# Patient Record
Sex: Male | Born: 2000 | Race: Black or African American | Hispanic: No | Marital: Single | Smoking: Current some day smoker
Health system: Southern US, Community
[De-identification: ages and names within clinical notes are randomized; demographics above are authoritative.]

## PROBLEM LIST (undated history)

## (undated) DIAGNOSIS — F39 Unspecified mood [affective] disorder: Secondary | ICD-10-CM

## (undated) DIAGNOSIS — F4310 Post-traumatic stress disorder, unspecified: Secondary | ICD-10-CM

---
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
## 2022-05-27 ENCOUNTER — Emergency Department
Admission: EM | Admit: 2022-05-27 | Discharge: 2022-05-27 | Disposition: A | Discharge status: Home / Self Care | Payer: Medicaid Other | Attending: Emergency Medicine | Admitting: Emergency Medicine

## 2022-05-27 ENCOUNTER — Other Ambulatory Visit: Payer: Self-pay

## 2022-05-27 ENCOUNTER — Encounter: Payer: Self-pay | Admitting: Emergency Medicine

## 2022-05-27 DIAGNOSIS — T7840XA Allergy, unspecified, initial encounter: Secondary | ICD-10-CM | POA: Diagnosis not present

## 2022-05-27 MED ORDER — FAMOTIDINE 20 MG PO TABS
20.0000 mg | ORAL_TABLET | Freq: Once | ORAL | Status: AC
  Administered 2022-05-27: 20 mg via ORAL
  Filled 2022-05-27: qty 1

## 2022-05-27 MED ORDER — PREDNISONE 20 MG PO TABS
60.0000 mg | ORAL_TABLET | Freq: Once | ORAL | Status: AC
  Administered 2022-05-27: 60 mg via ORAL
  Filled 2022-05-27: qty 3

## 2022-05-27 NOTE — ED Provider Triage Note (Signed)
  Emergency Medicine Provider Triage Evaluation Note  DRUE HARR , a 21 y.o.male,  was evaluated in triage.  Pt complains of suspected allergic reaction.  Patient states that he accidentally ingested cinnamon, which she is allergic to.  It reportedly causes his throat to swell.  He states that his tonsils feel swollen.  No other symptoms at this time.  Patient took 2 pills of Benadryl prior to arrival.   Review of Systems  Positive: Throat swelling, allergic reaction Negative: Denies fever, chest pain, vomiting  Physical Exam  There were no vitals filed for this visit. Gen:   Awake, no distress   Resp:  Normal effort  MSK:   Moves extremities without difficulty  Other:  No wheezing on auscultation.  No significant oropharyngeal edema, no rashes.  Medical Decision Making  Given the patient's initial medical screening exam, the following diagnostic evaluation has been ordered. The patient will be placed in the appropriate treatment space, once one is available, to complete the evaluation and treatment. I have discussed the plan of care with the patient and I have advised the patient that an ED physician or mid-level practitioner will reevaluate their condition after the test results have been received, as the results may give them additional insight into the type of treatment they may need.    Diagnostics: None immediately.  Treatments: Prednisone, famotidine.   Varney Daily, [place] 05/27/22 [phone]

## 2022-05-27 NOTE — ED Provider Notes (Signed)
   [facility] Provider Note    Event Date/Time   First MD Initiated Contact with Patient 05/27/22 1324     (approximate)   History   Allergic Reaction   HPI  Roberto Harrison is a 21 y.o. male shortness of breath and felt like his throat was swelling after eating salmon.  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  Ate cinnamon just prior to arrival.  Received Pepcid and steroids upon arrival to the emergency department.  States that he is feeling much better.  No longer with shortness of breath.  Denies any rash or itching.     Physical Exam   Triage Vital Signs: ED Triage Vitals  Enc Vitals Group     BP 05/27/22 1212 (!) 142/77     Pulse Rate 05/27/22 1212 (!) 55     Resp 05/27/22 1212 20     Temp 05/27/22 1212 98.1 F (36.7 C)     Temp src --      SpO2 05/27/22 1212 100 %     Weight 05/27/22 1202 190 lb (86.2 kg)     Height 05/27/22 1202 6' (1.829 m)     Head Circumference --      Peak Flow --      Pain Score --      Pain Loc --      Pain Edu? --      Excl. in GC? --     Most recent vital signs: Vitals:   05/27/22 1212  BP: (!) 142/77  Pulse: (!) 55  Resp: 20  Temp: 98.1 F (36.7 C)  SpO2: 100%    Physical Exam Constitutional:      Appearance: He is well-developed.  HENT:     Head: Atraumatic.  Eyes:     Conjunctiva/sclera: Conjunctivae normal.  Cardiovascular:     Rate and Rhythm: Regular rhythm.  Pulmonary:     Effort: No respiratory distress.  Musculoskeletal:     Cervical back: Normal range of motion.  Skin:    General: Skin is warm.  Neurological:     Mental Status: He is alert. Mental status is at baseline.      IMPRESSION / MDM / ASSESSMENT AND PLAN / ED COURSE  I reviewed the triage vital signs and the nursing notes.  Differential diagnosis including allergic reaction, seasonal allergies  After treatment patient without any respiratory distress.  No signs of anaphylactic  reaction.  Do not feel that further treatment is necessary at this time.  Discussed not eating cinnamon or other allergies.  Given return precautions for any return of symptoms.  Labs Reviewed - No data to display       PROCEDURES:  Critical Care performed: No  Procedures  Patient's presentation is most consistent with acute, uncomplicated illness.   MEDICATIONS ORDERED IN ED: Medications  predniSONE (DELTASONE) tablet 60 mg (60 mg Oral Given 05/27/22 1211)  famotidine (PEPCID) tablet 20 mg (20 mg Oral Given 05/27/22 1211)    FINAL CLINICAL IMPRESSION(S) / ED DIAGNOSES   Final diagnoses:  Allergic reaction, initial encounter     Rx / DC Orders   ED Discharge Orders     None        Note:  This document was prepared using Dragon voice recognition software and may include unintentional dictation errors.   Corena Herter, MD 05/27/22 [phone]

## 2022-05-27 NOTE — ED Triage Notes (Signed)
Pt via POV from home. Pt allergic reaction to cinnamon with known allergy to cinnamon. Friend gave patient 2 benadryl around 15 mins ago. Pt is A&OX4 and NAD

## 2022-05-27 NOTE — ED Notes (Signed)
This RN reviewed paperwork with pt. No further complaints or questions. Pt ambulated to lobby. Signed discharge form placed in med rec box.  

## 2022-09-12 ENCOUNTER — Other Ambulatory Visit: Payer: Self-pay

## 2022-09-12 ENCOUNTER — Emergency Department
Admission: EM | Admit: 2022-09-12 | Discharge: 2022-09-12 | Disposition: A | Discharge status: Home / Self Care | Payer: Medicaid Other | Attending: Emergency Medicine | Admitting: Emergency Medicine

## 2022-09-12 DIAGNOSIS — A549 Gonococcal infection, unspecified: Secondary | ICD-10-CM | POA: Diagnosis not present

## 2022-09-12 DIAGNOSIS — R1030 Lower abdominal pain, unspecified: Secondary | ICD-10-CM | POA: Diagnosis present

## 2022-09-12 DIAGNOSIS — R300 Dysuria: Secondary | ICD-10-CM

## 2022-09-12 LAB — COMPREHENSIVE METABOLIC PANEL
ALT: 10 U/L (ref 0–44)
AST: 17 U/L (ref 15–41)
Albumin: 4 g/dL (ref 3.5–5.0)
Alkaline Phosphatase: 45 U/L (ref 38–126)
Anion gap: 6 (ref 5–15)
BUN: 11 mg/dL (ref 6–20)
CO2: 28 mmol/L (ref 22–32)
Calcium: 9 mg/dL (ref 8.9–10.3)
Chloride: 105 mmol/L (ref 98–111)
Creatinine, Ser: 1.08 mg/dL (ref 0.61–1.24)
GFR, Estimated: >60 mL/min (ref >60)
Glucose, Bld: 131 mg/dL — ABNORMAL HIGH (ref 70–99)
Potassium: 4.1 mmol/L (ref 3.5–5.1)
Sodium: 139 mmol/L (ref 135–145)
Total Bilirubin: 0.6 mg/dL (ref 0.3–1.2)
Total Protein: 6.9 g/dL (ref 6.5–8.1)

## 2022-09-12 LAB — CBC WITH DIFFERENTIAL/PLATELET
Abs Immature Granulocytes: 0.01 10*3/uL (ref 0.00–0.07)
Basophils Absolute: 0 10*3/uL (ref 0.0–0.1)
Basophils Relative: 1 %
Eosinophils Absolute: 0.1 10*3/uL (ref 0.0–0.5)
Eosinophils Relative: 1 %
HCT: 42.7 % (ref 39.0–52.0)
Hemoglobin: 14.1 g/dL (ref 13.0–17.0)
Immature Granulocytes: 0 %
Lymphocytes Relative: 26 %
Lymphs Abs: 1.3 10*3/uL (ref 0.7–4.0)
MCH: 30.5 pg (ref 26.0–34.0)
MCHC: 33 g/dL (ref 30.0–36.0)
MCV: 92.2 fL (ref 80.0–100.0)
Monocytes Absolute: 0.5 10*3/uL (ref 0.1–1.0)
Monocytes Relative: 10 %
Neutro Abs: 3.1 10*3/uL (ref 1.7–7.7)
Neutrophils Relative %: 62 %
Platelets: 224 10*3/uL (ref 150–400)
RBC: 4.63 MIL/uL (ref 4.22–5.81)
RDW: 13 % (ref 11.5–15.5)
WBC: 4.9 10*3/uL (ref 4.0–10.5)
nRBC: 0 % (ref 0.0–0.2)

## 2022-09-12 LAB — URINALYSIS, ROUTINE W REFLEX MICROSCOPIC
Bilirubin Urine: NEGATIVE
Glucose, UA: NEGATIVE mg/dL
Hgb urine dipstick: NEGATIVE
Ketones, ur: NEGATIVE mg/dL
Nitrite: NEGATIVE
Protein, ur: NEGATIVE mg/dL
Specific Gravity, Urine: 1.023 (ref 1.005–1.030)
WBC, UA: >50 WBC/hpf (ref 0–5)
pH: 6 (ref 5.0–8.0)

## 2022-09-12 LAB — URINE DRUG SCREEN, QUALITATIVE (ARMC ONLY)
Amphetamines, Ur Screen: NOT DETECTED
Barbiturates, Ur Screen: NOT DETECTED
Benzodiazepine, Ur Scrn: NOT DETECTED
Cannabinoid 50 Ng, Ur ~~LOC~~: POSITIVE — AB
Cocaine Metabolite,Ur ~~LOC~~: NOT DETECTED
MDMA (Ecstasy)Ur Screen: NOT DETECTED
Methadone Scn, Ur: NOT DETECTED
Opiate, Ur Screen: NOT DETECTED
Phencyclidine (PCP) Ur S: NOT DETECTED
Tricyclic, Ur Screen: NOT DETECTED

## 2022-09-12 LAB — CHLAMYDIA/NGC RT PCR (ARMC ONLY)
Chlamydia Tr: NOT DETECTED
N gonorrhoeae: DETECTED — AB

## 2022-09-12 MED ORDER — DOXYCYCLINE HYCLATE 100 MG PO CAPS: 100.0000 mg | ORAL_CAPSULE | Freq: Two times a day (BID) | ORAL | 0 refills | Status: AC

## 2022-09-12 MED ORDER — LIDOCAINE HCL (PF) 1 % IJ SOLN
5.0000 mL | Freq: Once | INTRAMUSCULAR | Status: AC
  Administered 2022-09-12: 5 mL
  Filled 2022-09-12: qty 5

## 2022-09-12 MED ORDER — CEFTRIAXONE SODIUM 1 G IJ SOLR
500.0000 mg | Freq: Once | INTRAMUSCULAR | Status: AC
  Administered 2022-09-12: 500 mg via INTRAMUSCULAR
  Filled 2022-09-12: qty 10

## 2022-09-12 NOTE — Discharge Instructions (Signed)
Follow-up with your primary care provider or Dr. Annabell Howells who is on-call for urology if any continued problems.  Also you can follow-up with the [facility] department STD clinic if you continue to have any continued urinary symptoms.  The results of your gonorrhea and Chlamydia test can be seen on MyChart.  The antibiotics today will also treat STD infections.  Avoid sexual activity for the next 5 days and also remember to wear condoms each time.  If you see that your test results on MyChart are positive you should tell your sexual partner to also be tested and treated.  This can be done through the health department for free.
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
## 2022-09-12 NOTE — ED Notes (Signed)
Lab called to add on Chlamydia order.
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
## 2022-11-29 ENCOUNTER — Emergency Department
Admission: EM | Admit: 2022-11-29 | Discharge: 2022-11-29 | Disposition: A | Discharge status: Home / Self Care | Payer: Medicaid Other | Attending: Emergency Medicine | Admitting: Emergency Medicine

## 2022-11-29 ENCOUNTER — Emergency Department: Payer: Medicaid Other

## 2022-11-29 DIAGNOSIS — Z23 Encounter for immunization: Secondary | ICD-10-CM | POA: Diagnosis not present

## 2022-11-29 DIAGNOSIS — Y92410 Unspecified street and highway as the place of occurrence of the external cause: Secondary | ICD-10-CM | POA: Diagnosis not present

## 2022-11-29 DIAGNOSIS — S99912A Unspecified injury of left ankle, initial encounter: Secondary | ICD-10-CM | POA: Diagnosis present

## 2022-11-29 DIAGNOSIS — S91012A Laceration without foreign body, left ankle, initial encounter: Secondary | ICD-10-CM | POA: Diagnosis not present

## 2022-11-29 MED ORDER — TETANUS-DIPHTH-ACELL PERTUSSIS 5-2.5-18.5 LF-MCG/0.5 IM SUSY
0.5000 mL | PREFILLED_SYRINGE | Freq: Once | INTRAMUSCULAR | Status: AC
  Administered 2022-11-29: 0.5 mL via INTRAMUSCULAR
  Filled 2022-11-29: qty 0.5

## 2022-11-29 MED ORDER — BACITRACIN 500 UNIT/GM EX OINT: 1.0000 | TOPICAL_OINTMENT | Freq: Two times a day (BID) | CUTANEOUS | 0 refills | Status: AC

## 2022-11-29 MED ORDER — BACITRACIN 500 UNIT/GM EX OINT: 1.0000 | TOPICAL_OINTMENT | Freq: Two times a day (BID) | CUTANEOUS | 0 refills | Status: DC
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
## 2022-11-29 NOTE — Discharge Instructions (Signed)
Apply bacitracin over your wounds.  If you develop redness or pus draining from them then please return to the emergency department.  You can take Tylenol and Motrin for pain.
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
## 2023-02-12 ENCOUNTER — Other Ambulatory Visit: Payer: Self-pay

## 2023-02-12 ENCOUNTER — Emergency Department
Admission: EM | Admit: 2023-02-12 | Discharge: 2023-02-12 | Disposition: A | Discharge status: Home / Self Care | Payer: PRIVATE HEALTH INSURANCE | Attending: Emergency Medicine | Admitting: Emergency Medicine

## 2023-02-12 DIAGNOSIS — R300 Dysuria: Secondary | ICD-10-CM | POA: Diagnosis not present

## 2023-02-12 DIAGNOSIS — X19XXXA Contact with other heat and hot substances, initial encounter: Secondary | ICD-10-CM | POA: Diagnosis not present

## 2023-02-12 DIAGNOSIS — T310 Burns involving less than 10% of body surface: Secondary | ICD-10-CM | POA: Diagnosis not present

## 2023-02-12 DIAGNOSIS — T24102A Burn of first degree of unspecified site of left lower limb, except ankle and foot, initial encounter: Secondary | ICD-10-CM | POA: Diagnosis present

## 2023-02-12 MED ORDER — METRONIDAZOLE 500 MG PO TABS
2000.0000 mg | ORAL_TABLET | Freq: Once | ORAL | Status: AC
  Administered 2023-02-12: 2000 mg via ORAL
  Filled 2023-02-12: qty 4

## 2023-02-12 MED ORDER — LIDOCAINE 5 % EX OINT: 1.0000 | TOPICAL_OINTMENT | CUTANEOUS | 0 refills | Status: AC | PRN
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
## 2023-02-15 NOTE — Group Note (Deleted)

## 2023-04-04 ENCOUNTER — Emergency Department
Admission: EM | Admit: 2023-04-04 | Discharge: 2023-04-04 | Disposition: A | Discharge status: Home / Self Care | Payer: PRIVATE HEALTH INSURANCE | Attending: Emergency Medicine | Admitting: Emergency Medicine

## 2023-04-04 ENCOUNTER — Other Ambulatory Visit: Payer: Self-pay

## 2023-04-04 DIAGNOSIS — Z1152 Encounter for screening for COVID-19: Secondary | ICD-10-CM | POA: Insufficient documentation

## 2023-04-04 DIAGNOSIS — R309 Painful micturition, unspecified: Secondary | ICD-10-CM | POA: Diagnosis present

## 2023-04-04 DIAGNOSIS — R300 Dysuria: Secondary | ICD-10-CM | POA: Diagnosis not present

## 2023-04-04 LAB — RESP PANEL BY RT-PCR (RSV, FLU A&B, COVID)  RVPGX2
Influenza A by PCR: NEGATIVE
Influenza B by PCR: NEGATIVE
Resp Syncytial Virus by PCR: NEGATIVE
SARS Coronavirus 2 by RT PCR: NEGATIVE

## 2023-04-04 LAB — URINALYSIS, COMPLETE (UACMP) WITH MICROSCOPIC
Bilirubin Urine: NEGATIVE
Glucose, UA: NEGATIVE mg/dL
Hgb urine dipstick: NEGATIVE
Ketones, ur: 20 mg/dL — AB
Leukocytes,Ua: NEGATIVE
Nitrite: NEGATIVE
Protein, ur: 30 mg/dL — AB
Specific Gravity, Urine: 1.028 (ref 1.005–1.030)
pH: 6 (ref 5.0–8.0)

## 2023-04-04 LAB — CHLAMYDIA/NGC RT PCR (ARMC ONLY)
Chlamydia Tr: NOT DETECTED
N gonorrhoeae: NOT DETECTED

## 2023-04-04 LAB — GROUP A STREP BY PCR: Group A Strep by PCR: NOT DETECTED

## 2023-04-04 MED ORDER — CEFTRIAXONE SODIUM 250 MG IJ SOLR
250.0000 mg | Freq: Once | INTRAMUSCULAR | Status: AC
  Administered 2023-04-04: 250 mg via INTRAMUSCULAR
  Filled 2023-04-04 (×2): qty 250

## 2023-04-04 MED ORDER — LIDOCAINE HCL (PF) 1 % IJ SOLN
5.0000 mL | Freq: Once | INTRAMUSCULAR | Status: AC
  Administered 2023-04-04: 5 mL
  Filled 2023-04-04: qty 5

## 2023-04-04 MED ORDER — AZITHROMYCIN 500 MG PO TABS
1000.0000 mg | ORAL_TABLET | Freq: Once | ORAL | Status: AC
  Administered 2023-04-04: 1000 mg via ORAL
  Filled 2023-04-04: qty 2
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
## 2023-04-06 ENCOUNTER — Other Ambulatory Visit: Payer: Self-pay

## 2023-04-06 DIAGNOSIS — J029 Acute pharyngitis, unspecified: Secondary | ICD-10-CM | POA: Diagnosis present

## 2023-04-06 DIAGNOSIS — J039 Acute tonsillitis, unspecified: Secondary | ICD-10-CM | POA: Diagnosis not present

## 2023-04-06 DIAGNOSIS — Z20822 Contact with and (suspected) exposure to covid-19: Secondary | ICD-10-CM | POA: Diagnosis not present

## 2023-04-06 LAB — GROUP A STREP BY PCR: Group A Strep by PCR: NOT DETECTED

## 2023-04-06 LAB — RESP PANEL BY RT-PCR (RSV, FLU A&B, COVID)  RVPGX2
Influenza A by PCR: NEGATIVE
Influenza B by PCR: NEGATIVE
Resp Syncytial Virus by PCR: NEGATIVE
SARS Coronavirus 2 by RT PCR: NEGATIVE

## 2023-04-06 MED ORDER — ACETAMINOPHEN 500 MG PO TABS
1000.0000 mg | ORAL_TABLET | Freq: Once | ORAL | Status: AC
  Administered 2023-04-06: 1000 mg via ORAL
  Filled 2023-04-06: qty 2

## 2023-04-06 NOTE — ED Triage Notes (Signed)
Pt presents to ER with c/o sore throat, fever and chills that started yesterday.  Pt also states he is having some sob at this time.  Pt is noted to be hyperventilating in triage at this time.  Pt not wanting to speak in triage d/t pain in throat, so pts s/o is providing history.  Pt is otherwise A&O x4 in triage.

## 2023-04-07 ENCOUNTER — Emergency Department
Admission: EM | Admit: 2023-04-07 | Discharge: 2023-04-07 | Disposition: A | Discharge status: Home / Self Care | Payer: PRIVATE HEALTH INSURANCE | Attending: Emergency Medicine | Admitting: Emergency Medicine

## 2023-04-07 ENCOUNTER — Emergency Department: Payer: PRIVATE HEALTH INSURANCE

## 2023-04-07 DIAGNOSIS — J039 Acute tonsillitis, unspecified: Secondary | ICD-10-CM

## 2023-04-07 DIAGNOSIS — J029 Acute pharyngitis, unspecified: Secondary | ICD-10-CM

## 2023-04-07 LAB — CBC WITH DIFFERENTIAL/PLATELET
Abs Immature Granulocytes: 0.02 10*3/uL (ref 0.00–0.07)
Basophils Absolute: 0 10*3/uL (ref 0.0–0.1)
Basophils Relative: 0 %
Eosinophils Absolute: 0 10*3/uL (ref 0.0–0.5)
Eosinophils Relative: 0 %
HCT: 41.9 % (ref 39.0–52.0)
Hemoglobin: 14.3 g/dL (ref 13.0–17.0)
Immature Granulocytes: 0 %
Lymphocytes Relative: 15 %
Lymphs Abs: 1 10*3/uL (ref 0.7–4.0)
MCH: 29.7 pg (ref 26.0–34.0)
MCHC: 34.1 g/dL (ref 30.0–36.0)
MCV: 86.9 fL (ref 80.0–100.0)
Monocytes Absolute: 1.5 10*3/uL — ABNORMAL HIGH (ref 0.1–1.0)
Monocytes Relative: 22 %
Neutro Abs: 4.4 10*3/uL (ref 1.7–7.7)
Neutrophils Relative %: 63 %
Platelets: 187 10*3/uL (ref 150–400)
RBC: 4.82 MIL/uL (ref 4.22–5.81)
RDW: 12.5 % (ref 11.5–15.5)
WBC: 7 10*3/uL (ref 4.0–10.5)
nRBC: 0 % (ref 0.0–0.2)

## 2023-04-07 LAB — BASIC METABOLIC PANEL
Anion gap: 13 (ref 5–15)
BUN: 19 mg/dL (ref 6–20)
CO2: 21 mmol/L — ABNORMAL LOW (ref 22–32)
Calcium: 9.2 mg/dL (ref 8.9–10.3)
Chloride: 99 mmol/L (ref 98–111)
Creatinine, Ser: 1.18 mg/dL (ref 0.61–1.24)
GFR, Estimated: >60 mL/min (ref >60)
Glucose, Bld: 97 mg/dL (ref 70–99)
Potassium: 3.5 mmol/L (ref 3.5–5.1)
Sodium: 133 mmol/L — ABNORMAL LOW (ref 135–145)

## 2023-04-07 MED ORDER — MAGIC MOUTHWASH
10.0000 mL | Freq: Once | ORAL | Status: DC
  Filled 2023-04-07: qty 10

## 2023-04-07 MED ORDER — KETOROLAC TROMETHAMINE 30 MG/ML IJ SOLN
15.0000 mg | Freq: Once | INTRAMUSCULAR | Status: AC
  Administered 2023-04-07: 15 mg via INTRAVENOUS
  Filled 2023-04-07: qty 1

## 2023-04-07 MED ORDER — SODIUM CHLORIDE 0.9 % IV SOLN
3.0000 g | Freq: Once | INTRAVENOUS | Status: AC
  Administered 2023-04-07: 3 g via INTRAVENOUS
  Filled 2023-04-07: qty 8

## 2023-04-07 MED ORDER — MAGIC MOUTHWASH W/LIDOCAINE
10.0000 mL | Freq: Once | ORAL | Status: AC
  Administered 2023-04-07: 10 mL via ORAL
  Filled 2023-04-07: qty 10

## 2023-04-07 MED ORDER — AMOXICILLIN-POT CLAVULANATE 600-42.9 MG/5ML PO SUSR: 600.0000 mg | Freq: Two times a day (BID) | ORAL | 0 refills | Status: AC

## 2023-04-07 MED ORDER — IOHEXOL 300 MG/ML  SOLN
75.0000 mL | Freq: Once | INTRAMUSCULAR | Status: AC | PRN
  Administered 2023-04-07: 75 mL via INTRAVENOUS

## 2023-04-07 MED ORDER — HYDROCODONE-ACETAMINOPHEN 7.5-325 MG/15ML PO SOLN
10.0000 mL | Freq: Once | ORAL | Status: AC
  Administered 2023-04-07: 10 mL via ORAL
  Filled 2023-04-07: qty 15

## 2023-04-07 MED ORDER — SODIUM CHLORIDE 0.9 % IV BOLUS
500.0000 mL | Freq: Once | INTRAVENOUS | Status: AC
  Administered 2023-04-07: 500 mL via INTRAVENOUS

## 2023-04-07 MED ORDER — HYDROCODONE-ACETAMINOPHEN 7.5-325 MG/15ML PO SOLN
10.0000 mL | Freq: Four times a day (QID) | ORAL | 0 refills | Status: AC | PRN
Start: 2023-04-07 — End: 2024-04-06

## 2023-04-07 MED ORDER — DEXAMETHASONE SODIUM PHOSPHATE 10 MG/ML IJ SOLN
10.0000 mg | Freq: Once | INTRAMUSCULAR | Status: AC
  Administered 2023-04-07: 10 mg via INTRAVENOUS
  Filled 2023-04-07: qty 1

## 2023-04-07 NOTE — Discharge Instructions (Signed)
Take and finish antibiotic as prescribed.  You may take Lortab elixir as needed for sore throat.  Drink plenty of liquids daily.  Return to the ER for worsening symptoms, persistent vomiting, difficulty breathing or other concerns.

## 2023-04-07 NOTE — ED Provider Notes (Signed)
[facility] Provider Note    Event Date/Time   First MD Initiated Contact with Patient 04/07/23 [phone]     (approximate)   History   Sore Throat   HPI  Roberto Harrison is a 22 y.o. male who presents to the ED from home with persistent sore throat, fever and chills.  Patient was seen in the ED several days ago for multiple medical complaints, treated empirically for STD with Rocephin and azithromycin.  STD screen has since come back negative.  Denies chest pain, shortness of breath, abdominal pain, nausea, vomiting or dizziness.     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 No pertinent surgical history.   Home Medications   Prior to Admission medications   Medication Sig Start Date End Date Taking? Authorizing Provider  bacitracin 500 UNIT/GM ointment Apply 1 Application topically 2 (two) times daily. 11/29/22   Georga Hacking, MD  doxycycline (VIBRAMYCIN) 100 MG capsule Take 1 capsule (100 mg total) by mouth 2 (two) times daily. 09/12/22   Tommi Rumps, PA-C  lidocaine (XYLOCAINE) 5 % ointment Apply 1 Application topically as needed. 02/12/23   Merwyn Katos, MD  cetirizine (ZYRTEC) 10 MG tablet Take 10 mg by mouth at bedtime as needed for allergies.   05/28/20  [provider]  chlorproMAZINE (THORAZINE) 50 MG tablet Take 50 mg by mouth at bedtime. Pt also takes every eight hours as needed for agitation.  05/28/20  [provider]  diphenhydrAMINE (BENADRYL) 50 MG capsule Take 50 mg by mouth at bedtime. Pt also takes every eight hours as needed for agitation.  05/28/20  [provider]  levothyroxine (SYNTHROID,  LEVOTHROID) 25 MCG tablet Take 25 mcg by mouth daily before breakfast.  05/28/20  [provider]  lithium carbonate (ESKALITH) 450 MG CR tablet Take 450 mg by mouth 2 (two) times daily.   05/28/20  [provider]  prazosin (MINIPRESS) 2 MG capsule Take 2 mg by mouth at bedtime.  05/28/20  [provider]     Allergies  Cinnamon flavor and Grass pollen(k-[id number]-r-t-swt vern)   Family History  History reviewed. No pertinent family history.   Physical Exam  Triage Vital Signs: ED Triage Vitals [04/06/23 2212]  Encounter Vitals Group     BP 121/72     Systolic BP Percentile      Diastolic BP Percentile      Pulse Rate (!) 107     Resp (!) 26     Temp (!) 100.7 F (38.2 C)     Temp Source Oral     SpO2 100 %     Weight      Height      Head Circumference      Peak Flow      Pain Score 10     Pain Loc      Pain Education      Exclude from Growth Chart     Updated Vital Signs: BP (!) 121/106 (BP Location: Right Arm)   Pulse 65   Temp 98.8 F (37.1 C) (Oral)   Resp 16   SpO2 99%  General: Awake, mild distress.  CV:  RRR.  Good peripheral perfusion.  Resp:  Normal effort.  CTAB. Abd:  Nontender.  No distention.  Other:  Moderately erythematous posterior oropharynx with bilateral tonsillar swelling and exudates, right greater than left.  Mildly hoarse voice.  There is no muffled voice or drooling.  Tolerating secretions well.  Shotty anterior cervical lymphadenopathy.   ED Results / Procedures / Treatments  Labs (all labs ordered are listed, but only abnormal results are displayed) Labs Reviewed  CBC WITH DIFFERENTIAL/PLATELET - Abnormal; Notable for the following components:      Result Value   Monocytes Absolute 1.5 (*)    All other components within normal limits  BASIC METABOLIC PANEL - Abnormal; Notable for the following components:   Sodium 133 (*)    CO2 21 (*)    All other components within normal limits  GROUP A STREP BY PCR   RESP PANEL BY RT-PCR (RSV, FLU A&B, COVID)  [id number]     EKG  None   RADIOLOGY I have independently visualized and interpreted patient's imaging study as well as noted the radiology interpretation:  CT soft tissue neck: Uncomplicated tonsillitis  Official radiology report(s): CT Soft Tissue Neck W Contrast  Result Date: 04/07/2023 CLINICAL DATA:  Sore throat with fever and chills started yesterday, epiglottitis or tonsillitis suspected. EXAM: CT NECK WITH CONTRAST TECHNIQUE: Multidetector CT imaging of the neck was performed using the standard protocol following the bolus administration of intravenous contrast. RADIATION DOSE REDUCTION: This exam was performed according to the departmental dose-optimization program which includes automated exposure control, adjustment of the mA and/or kV according to patient size and/or use of iterative reconstruction technique. CONTRAST:  75mL OMNIPAQUE IOHEXOL 300 MG/ML  SOLN COMPARISON:  None Available. FINDINGS: Pharynx and larynx: Tonsillar thickening without collection or retropharyngeal edema. No supraglottitis. Salivary glands: No inflammation, mass, or stone. Thyroid: Normal Lymph nodes: Adenitis appearance in the upper neck without cavitation Vascular: Negative. Limited intracranial: Negative. Visualized orbits: Negative. Mastoids and visualized paranasal sinuses: Clear Skeleton: Unremarkable Upper chest: Clear IMPRESSION: Uncomplicated tonsillitis. Electronically Signed   By: Tiburcio Pea M.D.   On: 04/07/2023 04:08     PROCEDURES:  Critical Care performed: No  Procedures   MEDICATIONS ORDERED IN ED: Medications  acetaminophen (TYLENOL) tablet 1,000 mg (1,000 mg Oral Given 04/06/23 2217)  Ampicillin-Sulbactam (UNASYN) 3 g in sodium chloride 0.9 % 100 mL IVPB (0 g Intravenous Stopped 04/07/23 0337)  dexamethasone (DECADRON) injection 10 mg (10 mg Intravenous Given 04/07/23 0225)  sodium chloride 0.9 % bolus 500 mL (0 mLs Intravenous  Stopped 04/07/23 0336)  ketorolac (TORADOL) 30 MG/ML injection 15 mg (15 mg Intravenous Given 04/07/23 0225)  magic mouthwash w/lidocaine (10 mLs Oral Given 04/07/23 0323)  iohexol (OMNIPAQUE) 300 MG/ML solution 75 mL (75 mLs Intravenous Contrast Given 04/07/23 0259)  HYDROcodone-acetaminophen (HYCET) 7.5-325 mg/15 ml solution 10 mL (10 mLs Oral Given 04/07/23 0347)     IMPRESSION / MDM / ASSESSMENT AND PLAN / ED COURSE  I reviewed the triage vital signs and the nursing notes.                             22 year old male presenting with persistent sore throat, painful swallowing, subjective fever/chills.  Differential diagnosis includes but is not limited to tonsillitis, peritonsillar abscess, retropharyngeal abscess, epiglottitis, etc.  I have personally reviewed patient's ER visit from 04/04/2023.  Patient's presentation is most consistent with acute illness /  injury with system symptoms.  Torrey panel and group A strep are negative.  Will obtain basic lab work, CT soft tissue neck.  Administer IV fluids, start IV Unasyn, Decadron, Toradol and Magic mouthwash for symptomatic relief.  Will reassess.  Clinical Course as of 04/07/23 0414  Wed Apr 07, 2023  0413 CT demonstrates tonsillitis.  Will discharge home on Augmentin, Lortab elixir and patient will follow-up with ENT.  Strict return precautions given.  Patient and family member verbalized understanding and agree with plan of care. [JS]    Clinical Course User Index [JS] Irean Hong, MD     FINAL CLINICAL IMPRESSION(S) / ED DIAGNOSES   Final diagnoses:  Sore throat  Tonsillitis     Rx / DC Orders   ED Discharge Orders     None        Note:  This document was prepared using Dragon voice recognition software and may include unintentional dictation errors.   Irean Hong, MD 04/07/23 [phone]

## 2023-04-10 ENCOUNTER — Other Ambulatory Visit: Payer: Self-pay

## 2023-04-10 ENCOUNTER — Encounter: Payer: Self-pay | Admitting: Emergency Medicine

## 2023-04-10 ENCOUNTER — Emergency Department
Admission: EM | Admit: 2023-04-10 | Discharge: 2023-04-10 | Disposition: A | Discharge status: Home / Self Care | Payer: PRIVATE HEALTH INSURANCE | Attending: Emergency Medicine | Admitting: Emergency Medicine

## 2023-04-10 DIAGNOSIS — J029 Acute pharyngitis, unspecified: Secondary | ICD-10-CM | POA: Insufficient documentation

## 2023-04-10 MED ORDER — LIDOCAINE VISCOUS HCL 2 % MT SOLN
15.0000 mL | Freq: Once | OROMUCOSAL | Status: AC
  Administered 2023-04-10: 15 mL via OROMUCOSAL
  Filled 2023-04-10: qty 15

## 2023-04-10 MED ORDER — MAGIC MOUTHWASH: ORAL | 0 refills | Status: AC

## 2023-04-10 MED ORDER — HYDROCODONE-ACETAMINOPHEN 7.5-325 MG/15ML PO SOLN
10.0000 mL | Freq: Once | ORAL | Status: AC
  Administered 2023-04-10: 10 mL via ORAL
  Filled 2023-04-10: qty 15

## 2023-04-10 MED ORDER — MAGIC MOUTHWASH
10.0000 mL | Freq: Once | ORAL | Status: AC
  Administered 2023-04-10: 10 mL via ORAL
  Filled 2023-04-10: qty 10

## 2023-04-10 NOTE — Discharge Instructions (Signed)
Continue to take and finish antibiotic as previously prescribed.  You may also take Magic mouthwash as needed for throat discomfort.  Drink plenty of fluids daily.  Return to the ER for worsening symptoms, persistent vomiting, difficulty breathing or other concerns.

## 2023-04-10 NOTE — ED Triage Notes (Signed)
Pt presents with increasing and worsening pain to his throat.  Has been taking meds as prescribed on 10/30 and states he has been doubling and tripling the doses but has gotten worse.

## 2023-04-10 NOTE — ED Provider Notes (Signed)
[facility] Provider Note    Event Date/Time   First MD Initiated Contact with Patient 04/10/23 0530     (approximate)   History   Sore Throat   HPI  Roberto Harrison is a 22 y.o. male who returns to the ED from home for persistent sore throat.  Patient was seen in the ED 04/07/2023 for for same and diagnosed with tonsillitis.  Had lab work and CT scan.  Placed on Amoxicillin and Hycet.  States Hycet is not controlling his pain.  Reports compliance with medications.  Denies fever/chills, chest pain, shortness of breath, abdominal pain, nausea, vomiting or dizziness.     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 No pertinent surgical history.   Home Medications   Prior to Admission medications   Medication Sig Start Date End Date Taking? Authorizing Provider  magic mouthwash SOLN 12mL Anbesol 30mL Benadryl 30mL Mylanta  5mL swish, gargle & spit q8hr prn throat discomfort 04/10/23  Yes Irean Hong, MD  amoxicillin-clavulanate (AUGMENTIN ES-600) 600-42.9 MG/5ML suspension Take 5 mLs (600 mg total) by mouth every 12 (twelve) hours for 10 days. 04/07/23 04/17/23  Irean Hong, MD  bacitracin 500 UNIT/GM ointment Apply 1 Application topically 2 (two) times daily. 11/29/22   Georga Hacking, MD  doxycycline (VIBRAMYCIN) 100 MG capsule Take 1 capsule (100 mg total) by mouth 2 (two) times daily. 09/12/22   Tommi Rumps, PA-C  HYDROcodone-acetaminophen (HYCET) 7.5-325 mg/15 ml solution Take 10 mLs by mouth every 6 (six) hours as needed for moderate pain (pain score 4-6). 04/07/23 04/06/24  Irean Hong, MD  lidocaine (XYLOCAINE) 5 % ointment Apply 1  Application topically as needed. 02/12/23   Merwyn Katos, MD  cetirizine (ZYRTEC) 10 MG tablet Take 10 mg by mouth at bedtime as needed for allergies.   05/28/20  [provider]  chlorproMAZINE (THORAZINE) 50 MG tablet Take 50 mg by mouth at bedtime. Pt also takes every eight hours as needed for agitation.  05/28/20  [provider]  diphenhydrAMINE (BENADRYL) 50 MG capsule Take 50 mg by mouth at bedtime. Pt also takes every eight hours as needed for agitation.  05/28/20  [provider]  levothyroxine (SYNTHROID, LEVOTHROID) 25 MCG tablet Take 25 mcg by mouth daily before breakfast.  05/28/20  [provider]  lithium carbonate (ESKALITH) 450 MG CR tablet Take 450 mg by mouth 2 (two) times daily.   05/28/20  [provider]  prazosin (MINIPRESS) 2 MG capsule Take 2 mg by mouth at bedtime.  05/28/20  [provider]     Allergies  Cinnamon flavor and Grass pollen(k-o-r-t-swt vern)   Family History  History reviewed. No pertinent family history.   Physical Exam  Triage Vital Signs: ED Triage Vitals  Encounter Vitals Group     BP 04/10/23 0101 126/83     Systolic BP Percentile --      Diastolic BP Percentile --      Pulse Rate 04/10/23 0101 84     Resp 04/10/23 0101 16     Temp 04/10/23 0101 98.9 F (37.2 C)  Temp Source 04/10/23 0101 Oral     SpO2 04/10/23 0101 97 %     Weight --      Height --      Head Circumference --      Peak Flow --      Pain Score 04/10/23 0101 10     Pain Loc --      Pain Education --      Exclude from Growth Chart --     Updated Vital Signs: BP 126/83 (BP Location: Left Arm)   Pulse 84   Temp 98.9 F (37.2 C) (Oral)   Resp 16   SpO2 97%    General: Asleep, shakened awake for exam, no distress.  CV:  RRR.  Good peripheral perfusion.  Resp:  Normal effort.  CTAB. Abd:  No distention.  Other:  Posterior oropharynx appears improved from prior exam with less erythema and exudates.  No  peritonsillar abscess.  There is no hoarse or muffled voice.  There is no drooling.   ED Results / Procedures / Treatments  Labs (all labs ordered are listed, but only abnormal results are displayed) Labs Reviewed - No data to display   EKG  None   RADIOLOGY None   Official radiology report(s): No results found.   PROCEDURES:  Critical Care performed: No  Procedures   MEDICATIONS ORDERED IN ED: Medications  magic mouthwash (has no administration in time range)  HYDROcodone-acetaminophen (HYCET) 7.5-325 mg/15 ml solution 10 mL (10 mLs Oral Given 04/10/23 0320)  lidocaine (XYLOCAINE) 2 % viscous mouth solution 15 mL (15 mLs Mouth/Throat Given 04/10/23 0320)     IMPRESSION / MDM / ASSESSMENT AND PLAN / ED COURSE  I reviewed the triage vital signs and the nursing notes.                             22 year old male returns to the ED for persistent sore throat.  I was the provider to examine patient's last visit.  Oropharynx appears improved compared to prior with less exudates and swelling will add Magic mouthwash, encourage patient to finish antibiotic and follow-up with ENT as needed.  Strict return precautions given.  Patient verbalizes understanding and agrees with plan of care.  Patient's presentation is most consistent with acute, uncomplicated illness.   FINAL CLINICAL IMPRESSION(S) / ED DIAGNOSES   Final diagnoses:  Sore throat     Rx / DC Orders   ED Discharge Orders          Ordered    magic mouthwash SOLN        04/10/23 0536             Note:  This document was prepared using Dragon voice recognition software and may include unintentional dictation errors.   Irean Hong, MD 04/10/23 [phone]

## 2024-02-10 ENCOUNTER — Emergency Department
Admission: EM | Admit: 2024-02-10 | Discharge: 2024-02-10 | Disposition: A | Discharge status: Home / Self Care | Payer: Self-pay | Attending: Emergency Medicine | Admitting: Emergency Medicine

## 2024-02-10 ENCOUNTER — Other Ambulatory Visit: Payer: Self-pay

## 2024-02-10 ENCOUNTER — Emergency Department: Payer: Self-pay

## 2024-02-10 DIAGNOSIS — W228XXA Striking against or struck by other objects, initial encounter: Secondary | ICD-10-CM | POA: Insufficient documentation

## 2024-02-10 DIAGNOSIS — S8011XA Contusion of right lower leg, initial encounter: Secondary | ICD-10-CM | POA: Insufficient documentation

## 2024-02-10 DIAGNOSIS — Y990 Civilian activity done for income or pay: Secondary | ICD-10-CM | POA: Insufficient documentation

## 2024-02-10 DIAGNOSIS — M7989 Other specified soft tissue disorders: Secondary | ICD-10-CM | POA: Insufficient documentation

## 2024-02-10 DIAGNOSIS — M79604 Pain in right leg: Secondary | ICD-10-CM
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
## 2024-02-10 NOTE — ED Provider Notes (Signed)
 [facility] Provider Note    Event Date/Time   First MD Initiated Contact with Patient 02/10/24 1556     (approximate)   History   Leg Pain   HPI  Roberto Harrison is a 23 y.o. 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  States when he moves this right leg, he feels pain from his ankle to his hip. Reports swelling to anterior right shin. Denies fever, chills, erythema.  Patient is filing Worker's Comp at this time.  Has taken 2 doses of 800 mg ibuprofen at home for his pain with not much relief. Denies fall or any other injury.    Physical Exam   Triage Vital Signs: ED Triage Vitals  Encounter Vitals Group     BP 02/10/24 1335 (!) 125/56     Girls Systolic BP Percentile --      Girls Diastolic BP Percentile --      Boys Systolic BP Percentile --      Boys Diastolic BP Percentile --      Pulse Rate 02/10/24 1335 (!) 52     Resp 02/10/24 1335 18     Temp 02/10/24 1335 98.3 F (36.8 C)     Temp Source 02/10/24 1335 Oral     SpO2 02/10/24 1335 100 %     Weight 02/10/24 1335 180 lb (81.6 kg)     Height 02/10/24 1335 6' 1 (1.854 m)     Head Circumference --      Peak Flow --      Pain Score 02/10/24 1329 6     Pain Loc --      Pain Education --      Exclude from Growth Chart --     Most recent vital signs: Vitals:   02/10/24 1335 02/10/24 1738  BP: (!) 125/56 124/66  Pulse: (!) 52 64  Resp: 18 18  Temp: 98.3 F (36.8 C) 98 F (36.7 C)  SpO2: 100% 100%    General: Awake, in no acute distress. Appears stated age. CV: Good peripheral perfusion. No b/l lower leg edema. Dorsalis pedis pulses 2+. Respiratory:Normal respiratory effort.  No respiratory distress.  GI: Soft, non-distended, non-tender.  MSK: Normal ROM and  5/5 strength in b/l lower extremities. TTP along right anterior shin with mild swelling  and ecchymoses. Skin:Warm, dry, intact. Neurological: A&Ox4 to person, place, time, and situation.   ED Results / Procedures / Treatments   Labs (all labs ordered are listed, but only abnormal results are displayed) Labs Reviewed - No data to display   EKG     RADIOLOGY X ray right tib/fib and doppler RLE DVT ordered.   PROCEDURES:  Critical Care performed: No   Procedures   MEDICATIONS ORDERED IN ED: Medications - No data to display   IMPRESSION / MDM / ASSESSMENT AND PLAN / ED COURSE  I reviewed the triage vital signs and the nursing notes.                              Differential diagnosis includes, but is not limited to, right leg contusion, tibia or fibula fracture, DVT  Patient's presentation is most consistent with acute complicated illness / injury requiring diagnostic workup.  Patient is a 23 year old male who presented with  right leg injury at work that occurred 3 days ago.  X-ray of the right leg without any acute fracture. I independently viewed the x-ray and radiologist's report.  I agree with the radiologist's report that there are no acute findings. Did Doppler ultrasound to check for DVT because he reported pain up into his right knee and hip when he moved his leg around; was negative for DVT.  Discussed OTC pain medication use at home and symptomatic relief with elevation and ice.  Did provide Ace wrap here and work note.  Also placed ambulatory for referral to primary care to establish care.  The patient may return to the emergency department for any new, worsening, or concerning symptoms. Patient was given the opportunity to ask questions; all questions were answered. Emergency department return precautions were discussed with the patient.  Patient is in agreement to the treatment plan.  Patient is stable for discharge.     FINAL CLINICAL IMPRESSION(S) / ED DIAGNOSES   Final diagnoses:  Right leg pain  Contusion of right leg, initial encounter      Rx / DC Orders   ED Discharge Orders          Ordered    Apply wrap       Comments: Right shin   02/10/24 1727    Ambulatory Referral to Primary Care (Establish Care)        02/10/24 1729             Note:  This document was prepared using Dragon voice recognition software and may include unintentional dictation errors.     Sheron Salm, PA-C 02/10/24 1742    Waymond Lorelle Cummins, MD 02/10/24 [phone]

## 2024-02-10 NOTE — Discharge Instructions (Addendum)
 You are seen in the emergency department for right leg bruise after an injury at work.  You do not have a fracture today.  Please wear the Ace wrap to help with swelling.  You may take Tylenol  and ibuprofen based on the dosing as recommended on the bottle.  You may return to work without any restrictions.  You may apply ice on and off for 20 to 30 minutes at a time to help with swelling and elevate your leg above your heart.  Please return to the emergency department for any new, worsening or concerning symptoms.  I have also placed a referral to you to establish care with a primary care provider for continued any of care; please give them a call at your earliest convenience from the list of resources.
